# Patient Record
Sex: Female | Born: 1990 | Race: White | Hispanic: No | Marital: Single | Smoking: Never smoker
Health system: Southern US, Community
[De-identification: ages and names within clinical notes are randomized; demographics above are authoritative.]

## PROBLEM LIST (undated history)

## (undated) ENCOUNTER — Inpatient Hospital Stay (HOSPITAL_COMMUNITY): Payer: Self-pay

## (undated) DIAGNOSIS — Z789 Other specified health status: Secondary | ICD-10-CM

## (undated) HISTORY — PX: NO PAST SURGERIES: SHX2092

---
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
## 2012-12-17 ENCOUNTER — Inpatient Hospital Stay (HOSPITAL_COMMUNITY)
Admission: AD | Admit: 2012-12-17 | Discharge: 2012-12-18 | DRG: 781 | Disposition: A | Discharge status: Home / Self Care | Payer: Medicaid Other | Source: Ambulatory Visit | Attending: Obstetrics & Gynecology | Admitting: Obstetrics & Gynecology

## 2012-12-17 ENCOUNTER — Encounter (HOSPITAL_COMMUNITY): Payer: Self-pay | Admitting: *Deleted

## 2012-12-17 ENCOUNTER — Inpatient Hospital Stay (HOSPITAL_COMMUNITY): Payer: Medicaid Other

## 2012-12-17 DIAGNOSIS — N949 Unspecified condition associated with female genital organs and menstrual cycle: Secondary | ICD-10-CM

## 2012-12-17 DIAGNOSIS — M549 Dorsalgia, unspecified: Secondary | ICD-10-CM | POA: Diagnosis present

## 2012-12-17 DIAGNOSIS — O4700 False labor before 37 completed weeks of gestation, unspecified trimester: Secondary | ICD-10-CM | POA: Diagnosis present

## 2012-12-17 DIAGNOSIS — A088 Other specified intestinal infections: Secondary | ICD-10-CM

## 2012-12-17 DIAGNOSIS — R1031 Right lower quadrant pain: Secondary | ICD-10-CM

## 2012-12-17 DIAGNOSIS — O99891 Other specified diseases and conditions complicating pregnancy: Principal | ICD-10-CM | POA: Diagnosis present

## 2012-12-17 DIAGNOSIS — O0930 Supervision of pregnancy with insufficient antenatal care, unspecified trimester: Secondary | ICD-10-CM

## 2012-12-17 DIAGNOSIS — A084 Viral intestinal infection, unspecified: Secondary | ICD-10-CM

## 2012-12-17 DIAGNOSIS — O4703 False labor before 37 completed weeks of gestation, third trimester: Secondary | ICD-10-CM

## 2012-12-17 DIAGNOSIS — O9989 Other specified diseases and conditions complicating pregnancy, childbirth and the puerperium: Secondary | ICD-10-CM

## 2012-12-17 HISTORY — DX: Other specified health status: Z78.9

## 2012-12-17 LAB — COMPREHENSIVE METABOLIC PANEL
ALT: 17 U/L (ref 0–35)
AST: 21 U/L (ref 0–37)
CO2: 21 mEq/L (ref 19–32)
Calcium: 8.7 mg/dL (ref 8.4–10.5)
Chloride: 95 mEq/L — ABNORMAL LOW (ref 96–112)
Creatinine, Ser: 0.42 mg/dL — ABNORMAL LOW (ref 0.50–1.10)
GFR calc Af Amer: >90 mL/min (ref >90)
GFR calc non Af Amer: >90 mL/min (ref >90)
Glucose, Bld: 100 mg/dL — ABNORMAL HIGH (ref 70–99)
Sodium: 132 mEq/L — ABNORMAL LOW (ref 135–145)
Total Bilirubin: 0.5 mg/dL (ref 0.3–1.2)

## 2012-12-17 LAB — CBC
Hemoglobin: 11.1 g/dL — ABNORMAL LOW (ref 12.0–15.0)
MCH: 27.5 pg (ref 26.0–34.0)
MCV: 81.9 fL (ref 78.0–100.0)
Platelets: 215 10*3/uL (ref 150–400)
RBC: 4.03 MIL/uL (ref 3.87–5.11)
WBC: 9.2 10*3/uL (ref 4.0–10.5)

## 2012-12-17 LAB — URINALYSIS, ROUTINE W REFLEX MICROSCOPIC
Ketones, ur: 15 mg/dL — AB
Nitrite: NEGATIVE
Protein, ur: NEGATIVE mg/dL
Urobilinogen, UA: 0.2 mg/dL (ref 0.0–1.0)

## 2012-12-17 LAB — WET PREP, GENITAL: Yeast Wet Prep HPF POC: NONE SEEN

## 2012-12-17 LAB — URINE MICROSCOPIC-ADD ON

## 2012-12-17 MED ORDER — BETAMETHASONE SOD PHOS & ACET 6 (3-3) MG/ML IJ SUSP
12.0000 mg | Freq: Once | INTRAMUSCULAR | Status: AC
  Administered 2012-12-17: 12 mg via INTRAMUSCULAR
  Filled 2012-12-17: qty 2

## 2012-12-17 MED ORDER — CALCIUM CARBONATE ANTACID 500 MG PO CHEW: 2.0000 | CHEWABLE_TABLET | ORAL | Status: DC | PRN

## 2012-12-17 MED ORDER — LACTATED RINGERS IV SOLN
INTRAVENOUS | Status: DC
  Administered 2012-12-17 – 2012-12-18 (×2): via INTRAVENOUS

## 2012-12-17 MED ORDER — CYCLOBENZAPRINE HCL 10 MG PO TABS
10.0000 mg | ORAL_TABLET | ORAL | Status: AC
  Administered 2012-12-17: 10 mg via ORAL
  Filled 2012-12-17: qty 1

## 2012-12-17 MED ORDER — CYCLOBENZAPRINE HCL 10 MG PO TABS: 5.0000 mg | ORAL_TABLET | Freq: Three times a day (TID) | ORAL | Status: DC | PRN

## 2012-12-17 MED ORDER — DOCUSATE SODIUM 100 MG PO CAPS: 100.0000 mg | ORAL_CAPSULE | Freq: Every day | ORAL | Status: DC

## 2012-12-17 MED ORDER — NIFEDIPINE 10 MG PO CAPS
10.0000 mg | ORAL_CAPSULE | ORAL | Status: AC
  Administered 2012-12-17: 10 mg via ORAL
  Filled 2012-12-17: qty 1

## 2012-12-17 MED ORDER — TERBUTALINE SULFATE 1 MG/ML IJ SOLN: 0.2500 mg | Freq: Once | INTRAMUSCULAR | Status: AC | PRN

## 2012-12-17 MED ORDER — ACETAMINOPHEN 500 MG PO TABS
1000.0000 mg | ORAL_TABLET | Freq: Once | ORAL | Status: AC
  Administered 2012-12-17: 1000 mg via ORAL
  Filled 2012-12-17: qty 2

## 2012-12-17 MED ORDER — ACETAMINOPHEN 325 MG PO TABS
650.0000 mg | ORAL_TABLET | ORAL | Status: DC | PRN
  Filled 2012-12-17: qty 2

## 2012-12-17 MED ORDER — LACTATED RINGERS IV BOLUS (SEPSIS)
1000.0000 mL | Freq: Once | INTRAVENOUS | Status: AC
  Administered 2012-12-17: 1000 mL via INTRAVENOUS

## 2012-12-17 MED ORDER — ZOLPIDEM TARTRATE 5 MG PO TABS: 5.0000 mg | ORAL_TABLET | Freq: Every evening | ORAL | Status: DC | PRN

## 2012-12-17 NOTE — MAU Provider Note (Signed)
Chief Complaint: No chief complaint on file.   First Provider Initiated Contact with Patient 12/17/12 1812     SUBJECTIVE HPI: Catherine Shaffer is a 22 y.o. G2P1001 who presents to maternity admissions reporting n/v/diarrhea starting last night with onset of cramping and sharp abdominal pain today.  She has irregular periods and did not know she was pregnant until her pos HPT last week so she has not started care.    Patient's last menstrual period was 08/02/2012.  This period was lighter than normal and she is not exactly sure of the date.  She plans to go to [facility] for care, as she went there for her last pregnancy.  She denies nausea or diarrhea currently.  She denies LOF, vaginal bleeding, vaginal itching/burning, urinary symptoms, h/a, dizziness, fever/chills, or recent exposure to illness.     Past Medical History  Diagnosis Date  . Medical history non-contributory    Past Surgical History  Procedure Laterality Date  . No past surgeries     History   Social History  . Marital Status: Single    Spouse Name: N/A    Number of Children: N/A  . Years of Education: N/A   Occupational History  . Not on file.   Social History Main Topics  . Smoking status: Never Smoker   . Smokeless tobacco: Not on file  . Alcohol Use: No  . Drug Use: No  . Sexually Active: Yes   Other Topics Concern  . Not on file   Social History Narrative  . No narrative on file   No current facility-administered medications on file prior to encounter.   No current outpatient prescriptions on file prior to encounter.   Allergies not on file  ROS: Pertinent items in HPI  OBJECTIVE Blood pressure 134/91, pulse 116, temperature 100.2 F (37.9 C), temperature source Oral, resp. rate 18, height 5\' 3"  (1.6 m), last menstrual period 08/02/2012. GENERAL: Well-developed, well-nourished female in no acute distress.  HEENT: Normocephalic HEART: normal rate RESP: normal effort ABDOMEN: Soft,  non-tender EXTREMITIES: Nontender, no edema NEURO: Alert and oriented Pelvic exam: Cervix pink, visually closed, without lesion, scant white creamy discharge, vaginal walls and external genitalia normal Bimanual exam: Cervix 0/long/high, firm, anterior Fundal height 22 cm  LAB RESULTS Results for orders placed during the hospital encounter of 12/17/12 (from the past 24 hour(s))  URINALYSIS, ROUTINE W REFLEX MICROSCOPIC     Status: Abnormal   Collection Time    12/17/12  5:30 PM      Result Value Range   Color, Urine YELLOW  YELLOW   APPearance CLEAR  CLEAR   Specific Gravity, Urine <1.005 (*) 1.005 - 1.030   pH 6.0  5.0 - 8.0   Glucose, UA NEGATIVE  NEGATIVE mg/dL   Hgb urine dipstick NEGATIVE  NEGATIVE   Bilirubin Urine NEGATIVE  NEGATIVE   Ketones, ur 15 (*) NEGATIVE mg/dL   Protein, ur NEGATIVE  NEGATIVE mg/dL   Urobilinogen, UA 0.2  0.0 - 1.0 mg/dL   Nitrite NEGATIVE  NEGATIVE   Leukocytes, UA TRACE (*) NEGATIVE  URINE MICROSCOPIC-ADD ON     Status: Abnormal   Collection Time    12/17/12  5:30 PM      Result Value Range   Squamous Epithelial / LPF FEW (*) RARE   WBC, UA 3-6  <3 WBC/hpf   RBC / HPF 0-2  <3 RBC/hpf   Bacteria, UA FEW (*) RARE  CBC     Status: Abnormal  Collection Time    12/17/12  6:30 PM      Result Value Range   WBC 9.2  4.0 - 10.5 K/uL   RBC 4.03  3.87 - 5.11 MIL/uL   Hemoglobin 11.1 (*) 12.0 - 15.0 g/dL   HCT 62.1 (*) 30.8 - 65.7 %   MCV 81.9  78.0 - 100.0 fL   MCH 27.5  26.0 - 34.0 pg   MCHC 33.6  30.0 - 36.0 g/dL   RDW 84.6  96.2 - 95.2 %   Platelets 215  150 - 400 K/uL  COMPREHENSIVE METABOLIC PANEL     Status: Abnormal   Collection Time    12/17/12  6:30 PM      Result Value Range   Sodium 132 (*) 135 - 145 mEq/L   Potassium 3.7  3.5 - 5.1 mEq/L   Chloride 95 (*) 96 - 112 mEq/L   CO2 21  19 - 32 mEq/L   Glucose, Bld 100 (*) 70 - 99 mg/dL   BUN 5 (*) 6 - 23 mg/dL   Creatinine, Ser 8.41 (*) 0.50 - 1.10 mg/dL   Calcium 8.7  8.4 - 32.4  mg/dL   Total Protein 6.8  6.0 - 8.3 g/dL   Albumin 2.9 (*) 3.5 - 5.2 g/dL   AST 21  0 - 37 U/L   ALT 17  0 - 35 U/L   Alkaline Phosphatase 151 (*) 39 - 117 U/L   Total Bilirubin 0.5  0.3 - 1.2 mg/dL   GFR calc non Af Amer >90  >90 mL/min   GFR calc Af Amer >90  >90 mL/min    IMAGING US Ob Limited  12/17/2012  *RADIOLOGY REPORT*  Clinical Data: Abdominal pain.  Evaluate placenta. AFI  LIMITED OBSTETRIC ULTRASOUND  Number of Fetuses: 1 Heart Rate: 163 bpm Movement: Yes Presentation: Cephalic Placental Location: Anterior Previa: No  No sonographic evidence of placental abruption.  Amniotic Fluid (Subjective): Normal.  AFI 12.08  ( 5%tile = 9 cm; 95%ile = 23.4 cm)  BPD:  8.2 cm      33 w  0 d         EDC:  02/04/2013  MATERNAL FINDINGS: Cervix: Closed Uterus/Adnexae:  Bilateral ovaries within normal limits.  IMPRESSION:  1.  No evidence for placental abruption. 2.  Normal amniotic fluid index.  Recommend followup with non-emergent complete OB 14+ wk US examination for fetal biometric evaluation and anatomic survey if not already performed.   Original Report Authenticated By: Signa Kell, M.D.     ASSESSMENT 1. Late prenatal care complicating pregnancy   2. Round ligament pain   3. Back pain in pregnancy   4. Viral gastroenteritis     PLAN Flexeril 10 mg PO x1 dose in MAU NST--FHR baseline 150 with moderate variability, positive accels, negative decels, Contractions lasting 50 seconds every 3-4 minutes LR 1000 ml x1 dose and Procardia 10 mg PO x1 dose in MAU Outpatient detail sono ordered for next week [facility] with [facility] or call [facility] if no availability  Report to Sid Falcon, [place]  Sharen Counter Certified Nurse-Midwife 12/17/2012  6:23 PM  Cervix recheck 1-2/25/-2  A: Preterm Contractions  P: Admit to antenatal Rapid GBS BMZ Detailed ultrasound in the AM [facility]

## 2012-12-17 NOTE — MAU Note (Signed)
Pt reports  Having sharp abd pain that started last night. Then started having N/V/D  several times during the night. Diarreha has stopped. N/V stopped around 3 this afternoon but cramping is starting up again.

## 2012-12-17 NOTE — H&P (Signed)
Chief Complaint: No chief complaint on file.   First Provider Initiated Contact with Patient 12/17/12 1812     SUBJECTIVE HPI: Catherine Shaffer is a 22 y.o. G2P1001 who presents to maternity admissions reporting n/v/diarrhea starting last night with onset of cramping and sharp abdominal pain today.  She has irregular periods and did not know she was pregnant until her pos HPT last week so she has not started care.    Patient's last menstrual period was 08/02/2012.  This period was lighter than normal and she is not exactly sure of the date.  She plans to go to [facility] for care, as she went there for her last pregnancy.  She denies nausea or diarrhea currently.  She denies LOF, vaginal bleeding, vaginal itching/burning, urinary symptoms, h/a, dizziness, fever/chills, or recent exposure to illness.     Past Medical History  Diagnosis Date  . Medical history non-contributory    Past Surgical History  Procedure Laterality Date  . No past surgeries     History   Social History  . Marital Status: Single    Spouse Name: N/A    Number of Children: N/A  . Years of Education: N/A   Occupational History  . Not on file.   Social History Main Topics  . Smoking status: Never Smoker   . Smokeless tobacco: Not on file  . Alcohol Use: No  . Drug Use: No  . Sexually Active: Yes   Other Topics Concern  . Not on file   Social History Narrative  . No narrative on file   No current facility-administered medications on file prior to encounter.   No current outpatient prescriptions on file prior to encounter.   Allergies not on file  ROS: Pertinent items in HPI  OBJECTIVE Blood pressure 134/91, pulse 116, temperature 100.2 F (37.9 C), temperature source Oral, resp. rate 18, height 5\' 3"  (1.6 m), last menstrual period 08/02/2012. GENERAL: Well-developed, well-nourished female in no acute distress.  HEENT: Normocephalic HEART: normal rate RESP: normal effort ABDOMEN: Soft,  non-tender EXTREMITIES: Nontender, no edema NEURO: Alert and oriented Pelvic exam: Cervix pink, visually closed, without lesion, scant white creamy discharge, vaginal walls and external genitalia normal Bimanual exam: Cervix 0/long/high, firm, anterior Fundal height 22 cm  LAB RESULTS Results for orders placed during the hospital encounter of 12/17/12 (from the past 24 hour(s))  URINALYSIS, ROUTINE W REFLEX MICROSCOPIC     Status: Abnormal   Collection Time    12/17/12  5:30 PM      Result Value Range   Color, Urine YELLOW  YELLOW   APPearance CLEAR  CLEAR   Specific Gravity, Urine <1.005 (*) 1.005 - 1.030   pH 6.0  5.0 - 8.0   Glucose, UA NEGATIVE  NEGATIVE mg/dL   Hgb urine dipstick NEGATIVE  NEGATIVE   Bilirubin Urine NEGATIVE  NEGATIVE   Ketones, ur 15 (*) NEGATIVE mg/dL   Protein, ur NEGATIVE  NEGATIVE mg/dL   Urobilinogen, UA 0.2  0.0 - 1.0 mg/dL   Nitrite NEGATIVE  NEGATIVE   Leukocytes, UA TRACE (*) NEGATIVE  URINE MICROSCOPIC-ADD ON     Status: Abnormal   Collection Time    12/17/12  5:30 PM      Result Value Range   Squamous Epithelial / LPF FEW (*) RARE   WBC, UA 3-6  <3 WBC/hpf   RBC / HPF 0-2  <3 RBC/hpf   Bacteria, UA FEW (*) RARE  CBC     Status: Abnormal  Collection Time    12/17/12  6:30 PM      Result Value Range   WBC 9.2  4.0 - 10.5 K/uL   RBC 4.03  3.87 - 5.11 MIL/uL   Hemoglobin 11.1 (*) 12.0 - 15.0 g/dL   HCT 16.1 (*) 09.6 - 04.5 %   MCV 81.9  78.0 - 100.0 fL   MCH 27.5  26.0 - 34.0 pg   MCHC 33.6  30.0 - 36.0 g/dL   RDW 40.9  81.1 - 91.4 %   Platelets 215  150 - 400 K/uL  COMPREHENSIVE METABOLIC PANEL     Status: Abnormal   Collection Time    12/17/12  6:30 PM      Result Value Range   Sodium 132 (*) 135 - 145 mEq/L   Potassium 3.7  3.5 - 5.1 mEq/L   Chloride 95 (*) 96 - 112 mEq/L   CO2 21  19 - 32 mEq/L   Glucose, Bld 100 (*) 70 - 99 mg/dL   BUN 5 (*) 6 - 23 mg/dL   Creatinine, Ser 7.82 (*) 0.50 - 1.10 mg/dL   Calcium 8.7  8.4 - 95.6  mg/dL   Total Protein 6.8  6.0 - 8.3 g/dL   Albumin 2.9 (*) 3.5 - 5.2 g/dL   AST 21  0 - 37 U/L   ALT 17  0 - 35 U/L   Alkaline Phosphatase 151 (*) 39 - 117 U/L   Total Bilirubin 0.5  0.3 - 1.2 mg/dL   GFR calc non Af Amer >90  >90 mL/min   GFR calc Af Amer >90  >90 mL/min    IMAGING US Ob Limited  12/17/2012  *RADIOLOGY REPORT*  Clinical Data: Abdominal pain.  Evaluate placenta. AFI  LIMITED OBSTETRIC ULTRASOUND  Number of Fetuses: 1 Heart Rate: 163 bpm Movement: Yes Presentation: Cephalic Placental Location: Anterior Previa: No  No sonographic evidence of placental abruption.  Amniotic Fluid (Subjective): Normal.  AFI 12.08  ( 5%tile = 9 cm; 95%ile = 23.4 cm)  BPD:  8.2 cm      33 w  0 d         EDC:  02/04/2013  MATERNAL FINDINGS: Cervix: Closed Uterus/Adnexae:  Bilateral ovaries within normal limits.  IMPRESSION:  1.  No evidence for placental abruption. 2.  Normal amniotic fluid index.  Recommend followup with non-emergent complete OB 14+ wk US examination for fetal biometric evaluation and anatomic survey if not already performed.   Original Report Authenticated By: Signa Kell, M.D.     ASSESSMENT 1. Late prenatal care complicating pregnancy   2. Round ligament pain   3. Back pain in pregnancy   4. Viral gastroenteritis     Report to Sid Falcon, [place]  Sharen Counter Certified Nurse-Midwife 12/17/2012  6:23 PM  Cervix recheck 1-2/25/-2  A: Preterm Contractions No prenatal care  P: Admit to antenatal Rapid GBS Urine UDS BMZ Procardia 30 mg BID Detailed ultrasound in the AM [facility]

## 2012-12-18 ENCOUNTER — Inpatient Hospital Stay (HOSPITAL_COMMUNITY): Payer: Medicaid Other

## 2012-12-18 ENCOUNTER — Encounter (HOSPITAL_COMMUNITY): Payer: Self-pay | Admitting: *Deleted

## 2012-12-18 LAB — RAPID URINE DRUG SCREEN, HOSP PERFORMED
Amphetamines: NOT DETECTED
Benzodiazepines: NOT DETECTED
Cocaine: NOT DETECTED
Opiates: NOT DETECTED
Tetrahydrocannabinol: NOT DETECTED

## 2012-12-18 LAB — GROUP B STREP BY PCR: Group B strep by PCR: NEGATIVE

## 2012-12-18 MED ORDER — NIFEDIPINE ER 30 MG PO TB24: 30.0000 mg | ORAL_TABLET | Freq: Two times a day (BID) | ORAL | Status: DC

## 2012-12-18 MED ORDER — PRENATAL MULTIVITAMIN CH: 1.0000 | ORAL_TABLET | Freq: Every day | ORAL | Status: DC

## 2012-12-18 MED ORDER — NIFEDIPINE ER 30 MG PO TB24
30.0000 mg | ORAL_TABLET | Freq: Two times a day (BID) | ORAL | Status: DC
  Administered 2012-12-18: 30 mg via ORAL
  Filled 2012-12-18 (×2): qty 1

## 2012-12-18 NOTE — Discharge Summary (Addendum)
Physician Discharge Summary  Patient ID: Catherine Shaffer MRN: [id number] DOB/AGE: October 30, 1991 22 y.o.  Admit date: 12/17/2012 Discharge date: 12/18/2012  Admission Diagnoses: 33.[redacted] weeks EGA, viral gastroenteritis, PTL, no prenatal care  Discharge Diagnoses: same  Active Problems:   * No active hospital problems. *   Discharged Condition: good  Hospital Course: Catherine Shaffer came to the MAU with RLQ pain following a day of n/v/d. The viral symptoms resolved but the toco showed regular contractions and her cervix changed from closed to 1-2 cm. She was admitted overnight for initiation of procardia tocolysis. By the next morning her cervix was unchanged and her contractions were resolved. She will get an anatomy ultrasound prior to discharge. She voiced her happiness at going home.  Consults: None  Significant Diagnostic Studies: labs: GBS culture pending  Treatments: IV hydration and procardia tocolysis  Discharge Exam: Blood pressure 113/61, pulse 104, temperature 98.2 F (36.8 C), temperature source Oral, resp. rate 18, height 5\' 3"  (1.6 m), weight 59.421 kg (131 lb), last menstrual period 07/03/2012, unknown if currently breastfeeding. General appearance: alert Resp: clear to auscultation bilaterally Cardio: regular rate and rhythm, S1, S2 normal, no murmur, click, rub or gallop GI: soft, non-tender; bowel sounds normal; no masses,  no organomegaly Uterus: benign, gravid CVX- 1/20/-2 FHR- reactive, no contractions  Disposition: home after ultrasound  Discharge Orders   Future Orders Complete By Expires     US OB Detail + 14 Wk  12/20/2012 (Approximate) 02/14/2014    Questions:      Preferred imaging location?:  [facility]    Reason for exam:  routine anatomy scan        Medication List    TAKE these medications       cyclobenzaprine 10 MG tablet  Commonly known as:  FLEXERIL  Take 0.5 tablets (5 mg total) by mouth 3 (three) times daily as needed for muscle spasms.      NIFEdipine 30 MG 24 hr tablet  Commonly known as:  PROCARDIA-XL/ADALAT CC  Take 1 tablet (30 mg total) by mouth 2 (two) times daily.     prenatal multivitamin Tabs  Take 1 tablet by mouth daily.     prenatal multivitamin Tabs  Take 1 tablet by mouth daily.           Follow-up Information   Follow up with [facility]. Schedule an appointment as soon as possible for a visit in 1 week. (Make appointment with prenatal provide in [place], or call number below for appointment in [facility] clinic. Return to MAU as needed.)    Contact information:   [address] [place] [address] [phone]      Signed: Allie Bossier 12/18/2012, 6:52 AM

## 2012-12-18 NOTE — Discharge Instructions (Signed)
Abdominal Pain During Pregnancy Abdominal discomfort is common in pregnancy. Most of the time, it does not cause harm. There are many causes of abdominal pain. Some causes are more serious than others. Some of the causes of abdominal pain in pregnancy are easily diagnosed. Occasionally, the diagnosis takes time to understand. Other times, the cause is not determined. Abdominal pain can be a sign that something is very wrong with the pregnancy, or the pain may have nothing to do with the pregnancy at all. For this reason, always tell your caregiver if you have any abdominal discomfort. CAUSES Common and harmless causes of abdominal pain include:  Constipation.  Excess gas and bloating.  Round ligament pain. This is pain that is felt in the folds of the groin.  The position the baby or placenta is in.  Baby kicks.  Braxton-Hicks contractions. These are mild contractions that do not cause cervical dilation. Serious causes of abdominal pain include:  Ectopic pregnancy. This happens when a fertilized egg implants outside of the uterus.  Miscarriage.  Preterm labor. This is when labor starts at less than 37 weeks of pregnancy.  Placental abruption. This is when the placenta partially or completely separates from the uterus.  Preeclampsia. This is often associated with high blood pressure and has been referred to as "toxemia in pregnancy."  Uterine or amniotic fluid infections. Causes unrelated to pregnancy include:  Urinary tract infection.  Gallbladder stones or inflammation.  Hepatitis or other liver illness.  Intestinal problems, stomach flu, food poisoning, or ulcer.  Appendicitis.  Kidney (renal) stones.  Kidney infection (pylonephritis). HOME CARE INSTRUCTIONS  For mild pain:  Do not have sexual intercourse or put anything in your vagina until your symptoms go away completely.  Get plenty of rest until your pain improves. If your pain does not improve in 1 hour, call  your caregiver.  Drink clear fluids if you feel nauseous. Avoid solid food as long as you are uncomfortable or nauseous.  Only take medicine as directed by your caregiver.  Keep all follow-up appointments with your caregiver. SEEK IMMEDIATE MEDICAL CARE IF:  You are bleeding, leaking fluid, or passing tissue from the vagina.  You have increasing pain or cramping.  You have persistent vomiting.  You have painful or bloody urination.  You have a fever.  You notice a decrease in your baby's movements.  You have extreme weakness or feel faint.  You have shortness of breath, with or without abdominal pain.  You develop a severe headache with abdominal pain.  You have abnormal vaginal discharge with abdominal pain.  You have persistent diarrhea.  You have abdominal pain that continues even after rest, or gets worse. MAKE SURE YOU:   Understand these instructions.  Will watch your condition.  Will get help right away if you are not doing well or get worse. Document Released: 10/19/2005 Document Revised: 01/11/2012 Document Reviewed: 05/15/2011 [facility] Patient Information 2013 [place], [place]. Back Pain in Pregnancy Back pain during pregnancy is common. It happens in about half of all pregnancies. It is important for you and your baby that you remain active during your pregnancy.If you feel that back pain is not allowing you to remain active or sleep well, it is time to see your caregiver. Back pain may be caused by several factors related to changes during your pregnancy.Fortunately, unless you had trouble with your back before your pregnancy, the pain is likely to get better after you deliver. Low back pain usually occurs between the fifth  and seventh months of pregnancy. It can, however, happen in the first couple months. Factors that increase the risk of back problems include:   Previous back problems.  Injury to your back.  Having twins or multiple births.  A  chronic cough.  Stress.  Job-related repetitive motions.  Muscle or spinal disease in the back.  Family history of back problems, ruptured (herniated) discs, or osteoporosis.  Depression, anxiety, and panic attacks. CAUSES   When you are pregnant, your body produces a hormone called relaxin. This hormonemakes the ligaments connecting the low back and pubic bones more flexible. This flexibility allows the baby to be delivered more easily. When your ligaments are loose, your muscles need to work harder to support your back. Soreness in your back can come from tired muscles. Soreness can also come from back tissues that are irritated since they are receiving less support.  As the baby grows, it puts pressure on the nerves and blood vessels in your pelvis. This can cause back pain.  As the baby grows and gets heavier during pregnancy, the uterus pushes the stomach muscles forward and changes your center of gravity. This makes your back muscles work harder to maintain good posture. SYMPTOMS  Lumbar pain during pregnancy Lumbar pain during pregnancy usually occurs at or above the waist in the center of the back. There may be pain and numbness that radiates into your leg or foot. This is similar to low back pain experienced by non-pregnant women. It usually increases with sitting for long periods of time, standing, or repetitive lifting. Tenderness may also be present in the muscles along your upper back. Posterior pelvic pain during pregnancy Pain in the back of the pelvis is more common than lumbar pain in pregnancy. It is a deep pain felt in your side at the waistline, or across the tailbone (sacrum), or in both places. You may have pain on one or both sides. This pain can also go into the buttocks and backs of the upper thighs. Pubic and groin pain may also be present. The pain does not quickly resolve with rest, and morning stiffness may also be present. Pelvic pain during pregnancy can be  brought on by most activities. A high level of fitness before and during pregnancy may or may not prevent this problem. Labor pain is usually 1 to 2 minutes apart, lasts for about 1 minute, and involves a bearing down feeling or pressure in your pelvis. However, if you are at term with the pregnancy, constant low back pain can be the beginning of early labor, and you should be aware of this. DIAGNOSIS  X-rays of the back should not be done during the first 12 to 14 weeks of the pregnancy and only when absolutely necessary during the rest of the pregnancy. MRIs do not give off radiation and are safe during pregnancy. MRIs also should only be done when absolutely necessary. HOME CARE INSTRUCTIONS  Exercise as directed by your caregiver. Exercise is the most effective way to prevent or manage back pain. If you have a back problem, it is especially important to avoid sports that require sudden body movements. Swimming and walking are great activities.  Do not stand in one place for long periods of time.  Do not wear high heels.  Sit in chairs with good posture. Use a pillow on your lower back if necessary. Make sure your head rests over your shoulders and is not hanging forward.  Try sleeping on your side, preferably the  left side, with a pillow or two between your legs. If you are sore after a night's rest, your bedmay betoo soft.Try placing a board between your mattress and box spring.  Listen to your body when lifting.If you are experiencing pain, ask for help or try bending yourknees more so you can use your leg muscles rather than your back muscles. Squat down when picking up something from the floor. Do not bend over.  Eat a healthy diet. Try to gain weight within your caregiver's recommendations.  Use heat or cold packs 3 to 4 times a day for 15 minutes to help with the pain.  Only take over-the-counter or prescription medicines for pain, discomfort, or fever as directed by your  caregiver. Sudden (acute) back pain  Use bed rest for only the most extreme, acute episodes of back pain. Prolonged bed rest over 48 hours will aggravate your condition.  Ice is very effective for acute conditions.  Put ice in a plastic bag.  Place a towel between your skin and the bag.  Leave the ice on for 10 to 20 minutes every 2 hours, or as needed.  Using heat packs for 30 minutes prior to activities is also helpful. Continued back pain See your caregiver if you have continued problems. Your caregiver can help or refer you for appropriate physical therapy. With conditioning, most back problems can be avoided. Sometimes, a more serious issue may be the cause of back pain. You should be seen right away if new problems seem to be developing. Your caregiver may recommend:  A maternity girdle.  An elastic sling.  A back brace.  A massage therapist or acupuncture. SEEK MEDICAL CARE IF:   You are not able to do most of your daily activities, even when taking the pain medicine you were given.  You need a referral to a physical therapist or chiropractor.  You want to try acupuncture. SEEK IMMEDIATE MEDICAL CARE IF:  You develop numbness, tingling, weakness, or problems with the use of your arms or legs.  You develop severe back pain that is no longer relieved with medicines.  You have a sudden change in bowel or bladder control.  You have increasing pain in other areas of the body.  You develop shortness of breath, dizziness, or fainting.  You develop nausea, vomiting, or sweating.  You have back pain which is similar to labor pains.  You have back pain along with your water breaking or vaginal bleeding.  You have back pain or numbness that travels down your leg.  Your back pain developed after you fell.  You develop pain on one side of your back. You may have a kidney stone.  You see blood in your urine. You may have a bladder infection or kidney stone.  You have  back pain with blisters. You may have shingles. Back pain is fairly common during pregnancy but should not be accepted as just part of the process. Back pain should always be treated as soon as possible. This will make your pregnancy as pleasant as possible. Document Released: 01/27/2006 Document Revised: 01/11/2012 Document Reviewed: 03/10/2011 [employer] Patient Information 2013 ExitCarPreterm Labor Preterm labor is when labor starts at less than 37 weeks of pregnancy. The normal length of a pregnancy is 39 to 41 weeks. CAUSES Often, there is no identifiable underlying cause as to why a woman goes into preterm labor. However, one of the most common known causes of preterm labor is infection. Infections of the uterus, cervix, vagina,  amniotic sac, bladder, kidney, or even the lungs (pneumonia) can cause labor to start. Other causes of preterm labor include:  Urogenital infections, such as yeast infections and bacterial vaginosis.  Uterine abnormalities (uterine shape, uterine septum, fibroids, bleeding from the placenta).  A cervix that has been operated on and opens prematurely.  Malformations in the baby.  Multiple gestations (twins, triplets, and so on).  Breakage of the amniotic sac. Additional risk factors for preterm labor include:  Previous history of preterm labor.  Premature rupture of membranes (PROM).  A placenta that covers the opening of the cervix (placenta previa).  A placenta that separates from the uterus (placenta abruption).  A cervix that is too weak to hold the baby in the uterus (incompetence cervix).  Having too much fluid in the amniotic sac (polyhydramnios).  Taking illegal drugs or smoking while pregnant.  Not gaining enough weight while pregnant.  Women younger than 38 and older than 23 years old.  Low socioeconomic status.  African-American ethnicity. SYMPTOMS Signs and symptoms of preterm labor include:  Menstrual-like cramps.  Contractions  that are 30 to 70 seconds apart, become very regular, closer together, and are more intense and painful.  Contractions that start on the top of the uterus and spread down to the lower abdomen and back.  A sense of increased pelvic pressure or back pain.  A watery or bloody discharge that comes from the vagina. DIAGNOSIS  A diagnosis can be confirmed by:  A vaginal exam.  An ultrasound of the cervix.  Sampling (swabbing) cervico-vaginal secretions. These samples can be tested for the presence of fetal fibronectin. This is a protein found in cervical discharge which is associated with preterm labor.  Fetal monitoring. TREATMENT  Depending on the length of the pregnancy and other circumstances, a caregiver may suggest bed rest. If necessary, there are medicines that can be given to stop contractions and to quicken fetal lung maturity. If labor happens before 34 weeks of pregnancy, a prolonged hospital stay may be recommended. Treatment depends on the condition of both the mother and baby. PREVENTION There are some things a mother can do to lower the risk of preterm labor in future pregnancies. A woman can:   Stop smoking.  Maintain healthy weight gain and avoid chemicals and drugs that are not necessary.  Be watchful for any type of infection.  Inform her caregiver if she has a known history of preterm labor. Document Released: 01/09/2004 Document Revised: 01/11/2012 Document Reviewed: 02/13/2011 [facility] Patient Information 2013 [place], [place].

## 2012-12-19 LAB — URINE CULTURE: Culture: NO GROWTH

## 2012-12-19 NOTE — Progress Notes (Signed)
Post discharge review completed. 

## 2013-10-22 ENCOUNTER — Encounter (HOSPITAL_COMMUNITY): Payer: Self-pay | Admitting: *Deleted

## 2013-11-17 ENCOUNTER — Emergency Department (HOSPITAL_COMMUNITY): Payer: Self-pay

## 2013-11-17 ENCOUNTER — Emergency Department (HOSPITAL_COMMUNITY)
Admission: EM | Admit: 2013-11-17 | Discharge: 2013-11-17 | Disposition: A | Discharge status: Home / Self Care | Payer: Self-pay | Attending: Emergency Medicine | Admitting: Emergency Medicine

## 2013-11-17 ENCOUNTER — Encounter (HOSPITAL_COMMUNITY): Payer: Self-pay | Admitting: Emergency Medicine

## 2013-11-17 DIAGNOSIS — Z880 Allergy status to penicillin: Secondary | ICD-10-CM | POA: Insufficient documentation

## 2013-11-17 DIAGNOSIS — R112 Nausea with vomiting, unspecified: Secondary | ICD-10-CM | POA: Insufficient documentation

## 2013-11-17 DIAGNOSIS — R109 Unspecified abdominal pain: Secondary | ICD-10-CM

## 2013-11-17 DIAGNOSIS — R1031 Right lower quadrant pain: Secondary | ICD-10-CM | POA: Insufficient documentation

## 2013-11-17 DIAGNOSIS — Z3202 Encounter for pregnancy test, result negative: Secondary | ICD-10-CM | POA: Insufficient documentation

## 2013-11-17 LAB — CBC WITH DIFFERENTIAL/PLATELET
BASOS ABS: 0 10*3/uL (ref 0.0–0.1)
BASOS PCT: 0 % (ref 0–1)
Eosinophils Absolute: 0.1 10*3/uL (ref 0.0–0.7)
Eosinophils Relative: 1 % (ref 0–5)
HEMATOCRIT: 37.8 % (ref 36.0–46.0)
HEMOGLOBIN: 13.6 g/dL (ref 12.0–15.0)
LYMPHS PCT: 57 % — AB (ref 12–46)
Lymphs Abs: 2.3 10*3/uL (ref 0.7–4.0)
MCH: 30.1 pg (ref 26.0–34.0)
MCHC: 36 g/dL (ref 30.0–36.0)
MCV: 83.6 fL (ref 78.0–100.0)
MONO ABS: 0.4 10*3/uL (ref 0.1–1.0)
Monocytes Relative: 10 % (ref 3–12)
NEUTROS ABS: 1.3 10*3/uL — AB (ref 1.7–7.7)
NEUTROS PCT: 32 % — AB (ref 43–77)
Platelets: 224 10*3/uL (ref 150–400)
RBC: 4.52 MIL/uL (ref 3.87–5.11)
RDW: 13.8 % (ref 11.5–15.5)
WBC: 4.1 10*3/uL (ref 4.0–10.5)

## 2013-11-17 LAB — WET PREP, GENITAL
Trich, Wet Prep: NONE SEEN
YEAST WET PREP: NONE SEEN

## 2013-11-17 LAB — PREGNANCY, URINE: PREG TEST UR: NEGATIVE

## 2013-11-17 LAB — URINALYSIS, ROUTINE W REFLEX MICROSCOPIC
GLUCOSE, UA: NEGATIVE mg/dL
Hgb urine dipstick: NEGATIVE
NITRITE: NEGATIVE
PH: 5.5 (ref 5.0–8.0)
Specific Gravity, Urine: >1.03 — ABNORMAL HIGH (ref 1.005–1.030)
Urobilinogen, UA: 0.2 mg/dL (ref 0.0–1.0)

## 2013-11-17 LAB — BASIC METABOLIC PANEL
BUN: 13 mg/dL (ref 6–23)
CHLORIDE: 99 meq/L (ref 96–112)
CO2: 22 meq/L (ref 19–32)
CREATININE: 0.5 mg/dL (ref 0.50–1.10)
Calcium: 9.5 mg/dL (ref 8.4–10.5)
GFR calc non Af Amer: >90 mL/min (ref >90)
Glucose, Bld: 109 mg/dL — ABNORMAL HIGH (ref 70–99)
POTASSIUM: 3.4 meq/L — AB (ref 3.7–5.3)
Sodium: 136 mEq/L — ABNORMAL LOW (ref 137–147)

## 2013-11-17 LAB — URINE MICROSCOPIC-ADD ON

## 2013-11-17 MED ORDER — SODIUM CHLORIDE 0.9 % IV BOLUS (SEPSIS)
1000.0000 mL | Freq: Once | INTRAVENOUS | Status: AC
  Administered 2013-11-17: 1000 mL via INTRAVENOUS

## 2013-11-17 MED ORDER — IOHEXOL 300 MG/ML  SOLN
100.0000 mL | Freq: Once | INTRAMUSCULAR | Status: AC | PRN
Start: 2013-11-17 — End: 2013-11-17
  Administered 2013-11-17: 100 mL via INTRAVENOUS

## 2013-11-17 MED ORDER — DIPHENHYDRAMINE HCL 50 MG/ML IJ SOLN
12.5000 mg | Freq: Once | INTRAMUSCULAR | Status: AC
  Administered 2013-11-17: 12.5 mg via INTRAVENOUS
  Filled 2013-11-17: qty 1

## 2013-11-17 MED ORDER — MORPHINE SULFATE 4 MG/ML IJ SOLN
4.0000 mg | Freq: Once | INTRAMUSCULAR | Status: AC
  Administered 2013-11-17: 4 mg via INTRAVENOUS
  Filled 2013-11-17: qty 1

## 2013-11-17 MED ORDER — IOHEXOL 300 MG/ML  SOLN
50.0000 mL | Freq: Once | INTRAMUSCULAR | Status: AC | PRN
  Administered 2013-11-17: 50 mL via ORAL

## 2013-11-17 MED ORDER — HYDROCODONE-ACETAMINOPHEN 5-325 MG PO TABS: 1.0000 | ORAL_TABLET | Freq: Four times a day (QID) | ORAL | Status: AC | PRN

## 2013-11-17 NOTE — ED Provider Notes (Signed)
CSN: [id number]     Arrival date & time 11/17/13  4098 History  This chart was scribed for Catherine Baton, MD by Dorothey Baseman, ED Scribe. This patient was seen in room APA03/APA03 and the patient's care was started at 10:06 AM.    Chief Complaint  Patient presents with  . Flank Pain   The history is provided by the patient. No language interpreter was used.   HPI Comments: Catherine Shaffer is a 23 y.o. female who presents to the Emergency Department complaining of a constant pain to the right flank, described as a "pinching" and is 7/10 currently, that radiates to the right side of the groin onset 3 days ago. Patient reports that the pain radiation has been gradually improving. She states that the pain is exacerbated with movement and alleviated with laying down/rest. She denies any potential injury or trauma to the area or recent heavy lifting. She reports associated nausea and emesis yesterday. Patient reports that she has experienced similar symptoms before after giving birth to her last child. She denies dysuria, hematuria, bowel or bladder incontinence. She denies history of kidney stones. She denies the possibility of being pregnant and currently uses Mirena for birth control. Patient has no other pertinent medical history.   Past Medical History  Diagnosis Date  . Medical history non-contributory    Past Surgical History  Procedure Laterality Date  . No past surgeries     Family History  Problem Relation Age of Onset  . Hypertension Father    History  Substance Use Topics  . Smoking status: Never Smoker   . Smokeless tobacco: Never Used  . Alcohol Use: No   OB History   Grav Para Term Preterm Abortions TAB SAB Ect Mult Living   2 1 1       1      Review of Systems  Constitutional: Negative for fever.  Respiratory: Negative for cough, chest tightness and shortness of breath.   Cardiovascular: Negative for chest pain.  Gastrointestinal: Positive for nausea and vomiting.  Negative for abdominal pain.  Genitourinary: Positive for flank pain. Negative for dysuria, hematuria, vaginal discharge and difficulty urinating.  Musculoskeletal: Negative for back pain.  Neurological: Negative for headaches.  All other systems reviewed and are negative.    Allergies  Penicillins  Home Medications   Current Outpatient Rx  Name  Route  Sig  Dispense  Refill  . ibuprofen (ADVIL,MOTRIN) 200 MG tablet   Oral   Take 400 mg by mouth every 8 (eight) hours as needed for moderate pain.         Marland Kitchen HYDROcodone-acetaminophen (NORCO/VICODIN) 5-325 MG per tablet   Oral   Take 1 tablet by mouth every 6 (six) hours as needed.   10 tablet   0    Triage Vitals: BP 115/77  Pulse 69  Temp(Src) 98 F (36.7 C) (Oral)  Resp 18  SpO2 100%  LMP 11/01/2013  Physical Exam  Nursing note and vitals reviewed. Constitutional: She is oriented to person, place, and time. She appears well-developed and well-nourished. No distress.  HENT:  Head: Normocephalic and atraumatic.  Eyes: Pupils are equal, round, and reactive to light.  Neck: Neck supple.  Cardiovascular: Normal rate, regular rhythm and normal heart sounds.   Pulmonary/Chest: Effort normal and breath sounds normal. No respiratory distress. She has no wheezes.  Abdominal: Soft. Bowel sounds are normal. There is tenderness. There is no rebound and no guarding.  RLQ tenderness  Neurological: She is alert  and oriented to person, place, and time.  Skin: Skin is warm and dry.  Psychiatric: She has a normal mood and affect.  GU:  NO significant vaginal discharge, normal external genitalia, no cervical motion tenderness or adnexal tenderness  ED Course  Procedures (including critical care time)  DIAGNOSTIC STUDIES: Oxygen Saturation is 100% on room air, normal by my interpretation.    COORDINATION OF CARE: 10:08 AM- Will order blood labs and UA. Will order IV fluids and morphine to manage symptoms. Discussed treatment plan  with patient at bedside and patient verbalized agreement.   11:07 AM- Will order a CT of the abdomen. Discussed treatment plan with patient at bedside and patient verbalized agreement.   Labs Review Labs Reviewed  WET PREP, GENITAL - Abnormal; Notable for the following:    Clue Cells Wet Prep HPF POC FEW (*)    WBC, Wet Prep HPF POC MANY (*)    All other components within normal limits  URINALYSIS, ROUTINE W REFLEX MICROSCOPIC - Abnormal; Notable for the following:    Specific Gravity, Urine >1.030 (*)    Bilirubin Urine SMALL (*)    Ketones, ur TRACE (*)    Protein, ur TRACE (*)    Leukocytes, UA SMALL (*)    All other components within normal limits  CBC WITH DIFFERENTIAL - Abnormal; Notable for the following:    Neutrophils Relative % 32 (*)    Neutro Abs 1.3 (*)    Lymphocytes Relative 57 (*)    All other components within normal limits  BASIC METABOLIC PANEL - Abnormal; Notable for the following:    Sodium 136 (*)    Potassium 3.4 (*)    Glucose, Bld 109 (*)    All other components within normal limits  URINE MICROSCOPIC-ADD ON - Abnormal; Notable for the following:    Squamous Epithelial / LPF MANY (*)    All other components within normal limits  GC/CHLAMYDIA PROBE AMP  PREGNANCY, URINE   Imaging Review Ct Abdomen Pelvis W Contrast  11/17/2013   CLINICAL DATA:  Right-sided abdominal pain of 3 days duration.  EXAM: CT ABDOMEN AND PELVIS WITH CONTRAST  TECHNIQUE: Multidetector CT imaging of the abdomen and pelvis was performed using the standard protocol following bolus administration of intravenous contrast.  CONTRAST:  50mL OMNIPAQUE IOHEXOL 300 MG/ML SOLN, OMNIPAQUE IOHEXOL 300 MG/ML SOLN  COMPARISON:  None.  FINDINGS: Lung bases are clear. No pleural or pericardial fluid. The liver has a normal appearance. No calcified gallstones. The spleen is normal. The pancreas is normal. The adrenal glands are normal. The kidneys are normal. No cyst, mass, stone or  hydronephrosis. The aorta and IVC are normal. The uterus is retroverted. IUD is in place. No acute finding. No adnexal lesion. No free fluid in the pelvis. The appendix is well seen in a normal location within normal appearance. No other bowel pathology.  IMPRESSION: No pathologic finding. No cause of right-sided pain identified. Normal appearing appendix.  Incidental note of retroverted uterus with IUD.   Electronically Signed   By: Paulina Fusi M.D.   On: 11/17/2013 12:57    EKG Interpretation   None      Medications  sodium chloride 0.9 % bolus 1,000 mL (0 mLs Intravenous Stopped 11/17/13 1233)  morphine 4 MG/ML injection 4 mg (4 mg Intravenous Given 11/17/13 1034)  diphenhydrAMINE (BENADRYL) injection 12.5 mg (12.5 mg Intravenous Given 11/17/13 1059)  iohexol (OMNIPAQUE) 300 MG/ML solution 50 mL (50 mLs Oral Contrast Given 11/17/13  1111)  iohexol (OMNIPAQUE) 300 MG/ML solution 100 mL (100 mLs Intravenous Contrast Given 11/17/13 1227)    MDM   1. Flank pain     Patient presents with right flank and right lower quadrant pain. She is nontoxic-appearing on exam. She has no history of kidney stones. She is tender on palpation. Considerations include kidney stones, appendicitis, and ovarian pathology including cyst versus torsion. Patient has had constant symptoms for 3 days.  CT is neg for appendicitis and kidney stones.  No noted ovarian pathology and pelvic exam benign.  Low suspicion for torsion given exam and imaging.  Patient continues to be well appearing.  No apparent etiology for pain.  Will send home with a short course of pain medication and PCP referral.  After history, exam, and medical workup I feel the patient has been appropriately medically screened and is safe for discharge home. Pertinent diagnoses were discussed with the patient. Patient was given return precautions.   I personally performed the services described in this documentation, which was scribed in my presence. The  recorded information has been reviewed and is accurate.    Catherine Batonourtney F Horton, MD 11/18/13 [phone]

## 2013-11-17 NOTE — ED Notes (Signed)
Pt c/o right side flank pain x3 days. Pt states "I had this same problem after I had my last child". Pt denies dysuria and hematuria. Pt states any movement worsens pain.

## 2013-11-17 NOTE — Discharge Instructions (Signed)
Abdominal Pain, Women °Abdominal (stomach, pelvic, or belly) pain can be caused by many things. It is important to tell your doctor: °· The location of the pain. °· Does it come and go or is it present all the time? °· Are there things that start the pain (eating certain foods, exercise)? °· Are there other symptoms associated with the pain (fever, nausea, vomiting, diarrhea)? °All of this is helpful to know when trying to find the cause of the pain. °CAUSES  °· Stomach: virus or bacteria infection, or ulcer. °· Intestine: appendicitis (inflamed appendix), regional ileitis (Crohn's disease), ulcerative colitis (inflamed colon), irritable bowel syndrome, diverticulitis (inflamed diverticulum of the colon), or cancer of the stomach or intestine. °· Gallbladder disease or stones in the gallbladder. °· Kidney disease, kidney stones, or infection. °· Pancreas infection or cancer. °· Fibromyalgia (pain disorder). °· Diseases of the female organs: °· Uterus: fibroid (non-cancerous) tumors or infection. °· Fallopian tubes: infection or tubal pregnancy. °· Ovary: cysts or tumors. °· Pelvic adhesions (scar tissue). °· Endometriosis (uterus lining tissue growing in the pelvis and on the pelvic organs). °· Pelvic congestion syndrome (female organs filling up with blood just before the menstrual period). °· Pain with the menstrual period. °· Pain with ovulation (producing an egg). °· Pain with an IUD (intrauterine device, birth control) in the uterus. °· Cancer of the female organs. °· Functional pain (pain not caused by a disease, may improve without treatment). °· Psychological pain. °· Depression. °DIAGNOSIS  °Your doctor will decide the seriousness of your pain by doing an examination. °· Blood tests. °· X-rays. °· Ultrasound. °· CT scan (computed tomography, special type of X-ray). °· MRI (magnetic resonance imaging). °· Cultures, for infection. °· Barium enema (dye inserted in the large intestine, to better view it with  X-rays). °· Colonoscopy (looking in intestine with a lighted tube). °· Laparoscopy (minor surgery, looking in abdomen with a lighted tube). °· Major abdominal exploratory surgery (looking in abdomen with a large incision). °TREATMENT  °The treatment will depend on the cause of the pain.  °· Many cases can be observed and treated at home. °· Over-the-counter medicines recommended by your caregiver. °· Prescription medicine. °· Antibiotics, for infection. °· Birth control pills, for painful periods or for ovulation pain. °· Hormone treatment, for endometriosis. °· Nerve blocking injections. °· Physical therapy. °· Antidepressants. °· Counseling with a psychologist or psychiatrist. °· Minor or major surgery. °HOME CARE INSTRUCTIONS  °· Do not take laxatives, unless directed by your caregiver. °· Take over-the-counter pain medicine only if ordered by your caregiver. Do not take aspirin because it can cause an upset stomach or bleeding. °· Try a clear liquid diet (broth or water) as ordered by your caregiver. Slowly move to a bland diet, as tolerated, if the pain is related to the stomach or intestine. °· Have a thermometer and take your temperature several times a day, and record it. °· Bed rest and sleep, if it helps the pain. °· Avoid sexual intercourse, if it causes pain. °· Avoid stressful situations. °· Keep your follow-up appointments and tests, as your caregiver orders. °· If the pain does not go away with medicine or surgery, you may try: °· Acupuncture. °· Relaxation exercises (yoga, meditation). °· Group therapy. °· Counseling. °SEEK MEDICAL CARE IF:  °· You notice certain foods cause stomach pain. °· Your home care treatment is not helping your pain. °· You need stronger pain medicine. °· You want your IUD removed. °· You feel faint or   lightheaded. °· You develop nausea and vomiting. °· You develop a rash. °· You are having side effects or an allergy to your medicine. °SEEK IMMEDIATE MEDICAL CARE IF:  °· Your  pain does not go away or gets worse. °· You have a fever. °· Your pain is felt only in portions of the abdomen. The right side could possibly be appendicitis. The left lower portion of the abdomen could be colitis or diverticulitis. °· You are passing blood in your stools (bright red or black tarry stools, with or without vomiting). °· You have blood in your urine. °· You develop chills, with or without a fever. °· You pass out. °MAKE SURE YOU:  °· Understand these instructions. °· Will watch your condition. °· Will get help right away if you are not doing well or get worse. °Document Released: 08/16/2007 Document Revised: 01/11/2012 Document Reviewed: 09/05/2009 °[employer]® Patient Information ©2014 [employer]. ° °

## 2013-11-18 LAB — GC/CHLAMYDIA PROBE AMP
CT PROBE, AMP APTIMA: NEGATIVE
GC PROBE AMP APTIMA: NEGATIVE

## 2014-09-03 ENCOUNTER — Encounter (HOSPITAL_COMMUNITY): Payer: Self-pay | Admitting: Emergency Medicine

## 2015-09-25 IMAGING — CT CT ABD-PELV W/ CM
2 of 3 series · 16 of 46 positions shown, 18 images · IV contrast (Omnipaque 300)
Comparison: None.

CLINICAL DATA: Right-sided abdominal pain of 3 days duration.

EXAM:
CT ABDOMEN AND PELVIS WITH CONTRAST
TECHNIQUE: Multidetector CT imaging of the abdomen and pelvis was performed
using the standard protocol following bolus administration of
intravenous contrast.
CONTRAST:  50mL OMNIPAQUE IOHEXOL 300 MG/ML SOLN, 100mL OMNIPAQUE
IOHEXOL 300 MG/ML SOLN

[Series 2: abd_pel_with 5.0 b40f · axial · 0.59mm/px · z∈[-424,-78]mm · 13 of 81 slices shown, 15 images]
[im 6/81  soft-tissue]
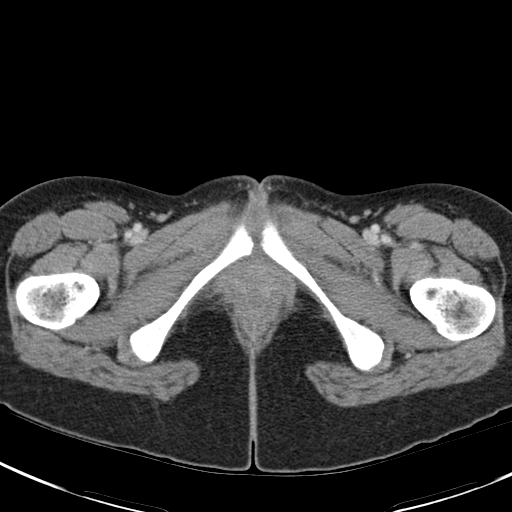
[im 6/81  bone]
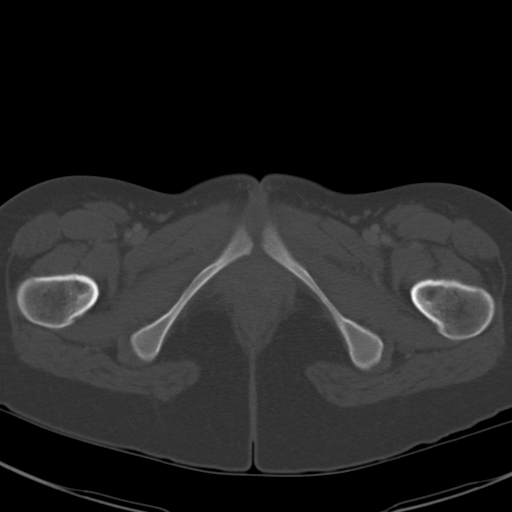
[im 11/81  soft-tissue]
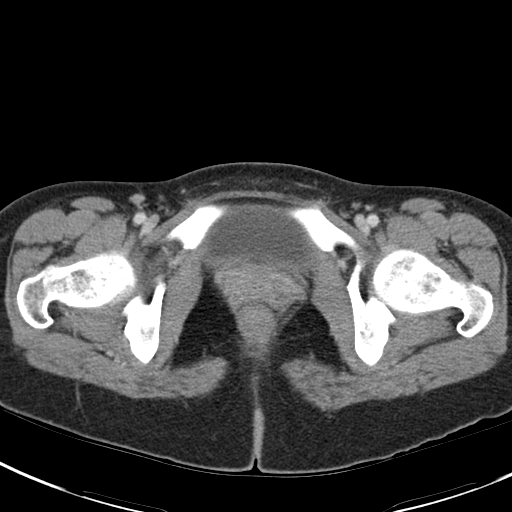
[im 16/81  soft-tissue]
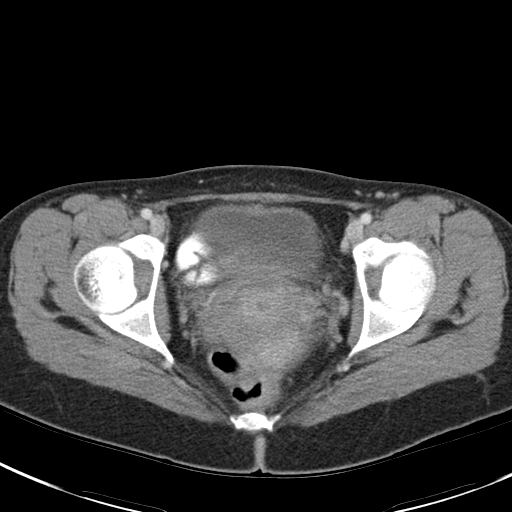
[im 24/81  soft-tissue]
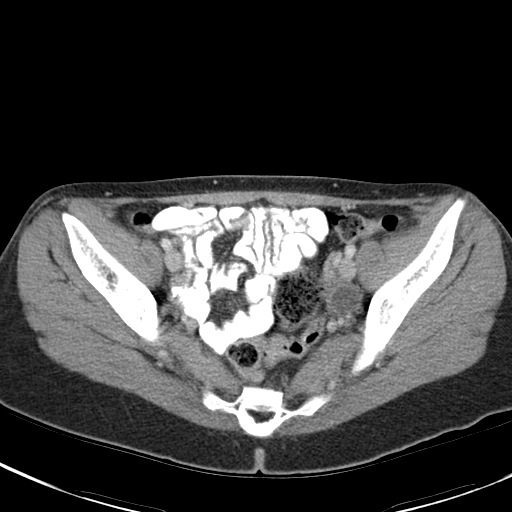
[im 29/81  soft-tissue]
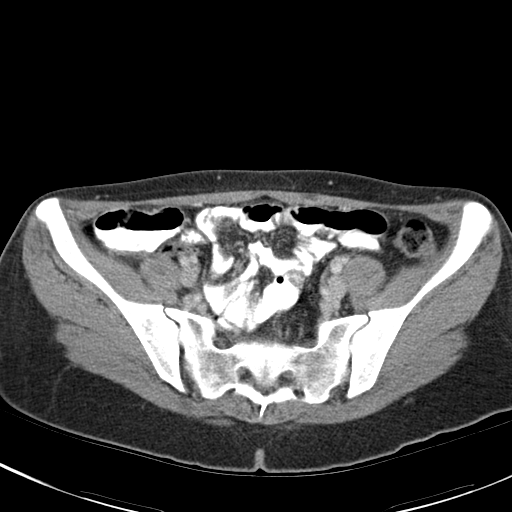
[im 34/81  soft-tissue]
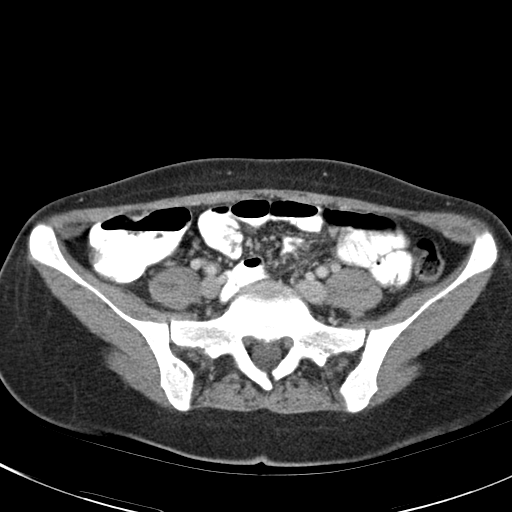
[im 42/81  soft-tissue]
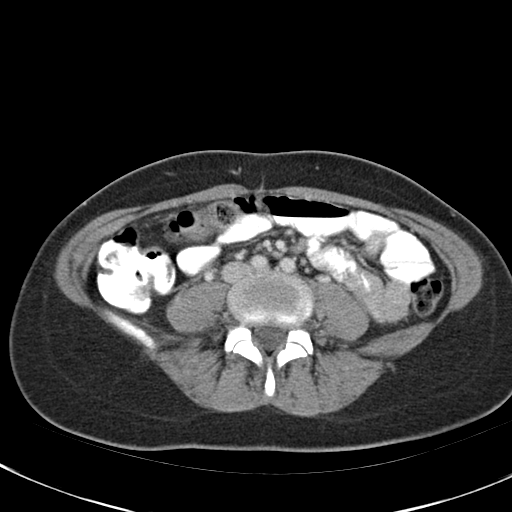
[im 47/81  soft-tissue]
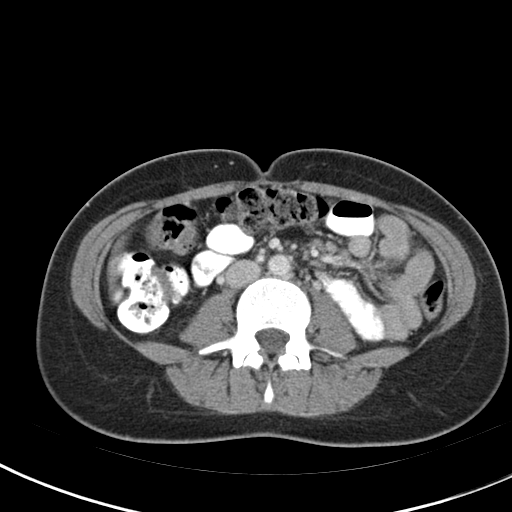
[im 52/81  soft-tissue]
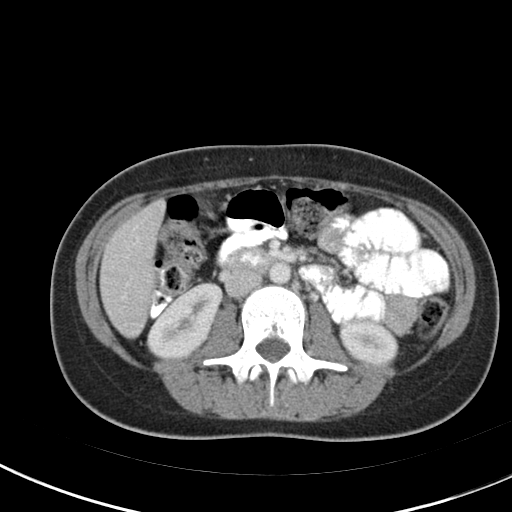
[im 52/81  bone]
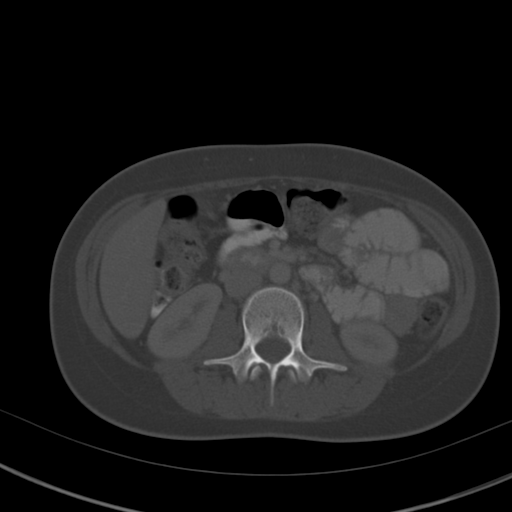
[im 57/81  soft-tissue]
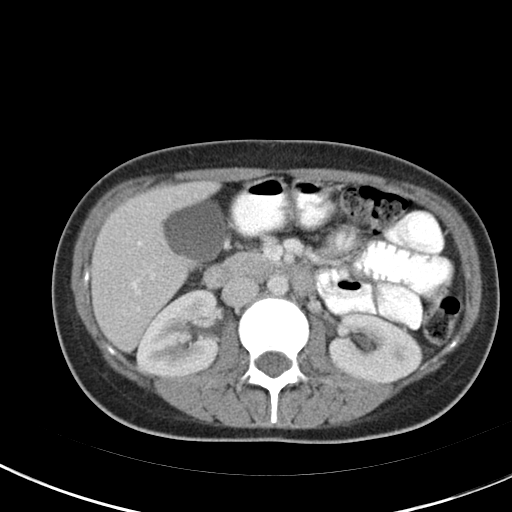
[im 65/81  soft-tissue]
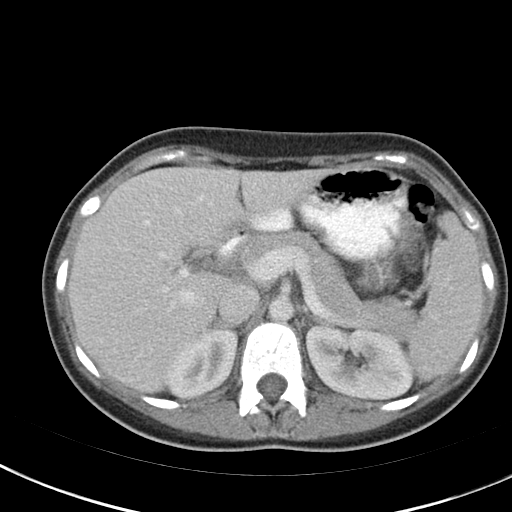
[im 70/81  soft-tissue]
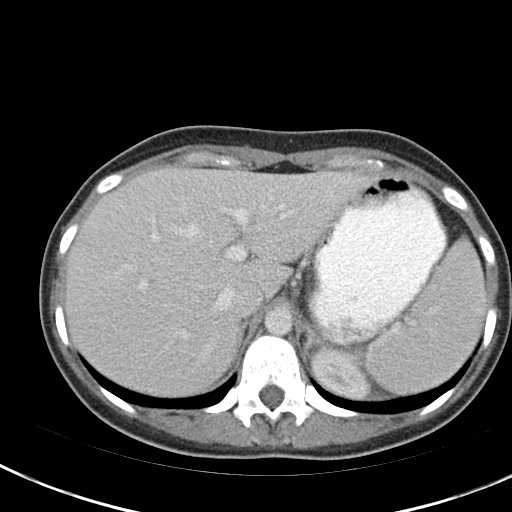
[im 75/81  soft-tissue]
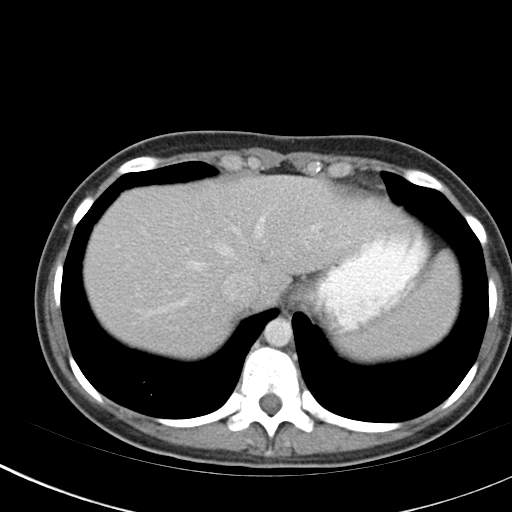

[Series 4: abd_pel_with 3.0 spo cor · coronal · 0.59mm/px · 3 of 59 slices shown]
[im 20/59  soft-tissue]
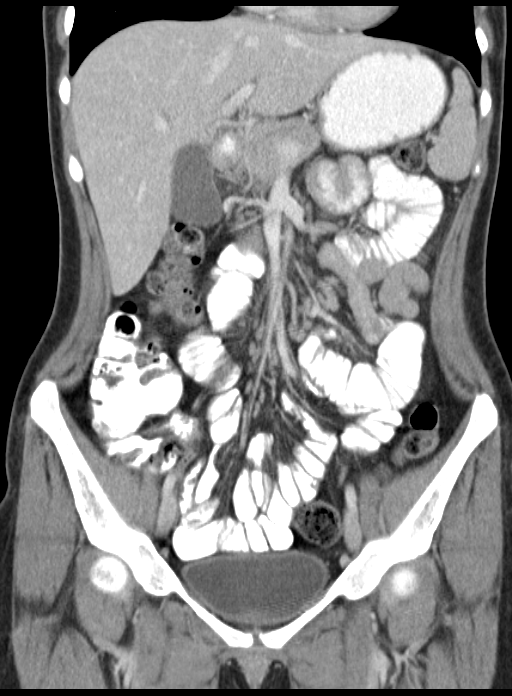
[im 26/59  soft-tissue]
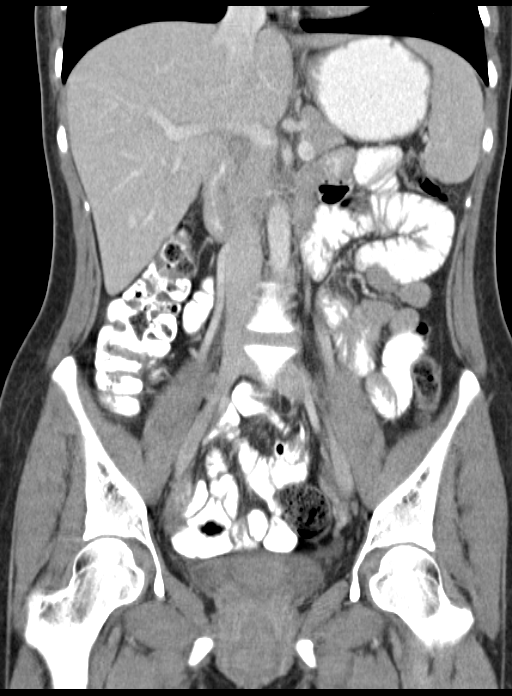
[im 33/59  soft-tissue]
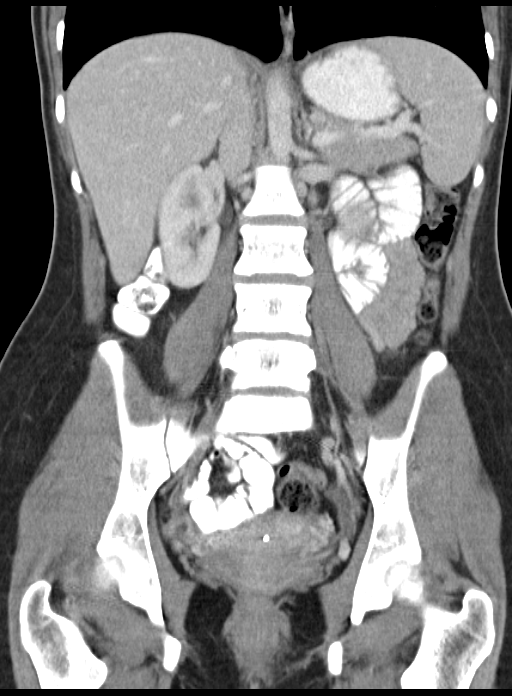

[16 of 46 positions shown; findings below may reference images not displayed]

FINDINGS: Lung bases are clear. No pleural or pericardial fluid. The liver has
a normal appearance. No calcified gallstones. The spleen is normal.
The pancreas is normal. The adrenal glands are normal. The kidneys
are normal. No cyst, mass, stone or hydronephrosis. The aorta and
IVC are normal. The uterus is retroverted. IUD is in place. No acute
finding. No adnexal lesion. No free fluid in the pelvis. The
appendix is well seen in a normal location within normal appearance.
No other bowel pathology.
IMPRESSION: No pathologic finding. No cause of right-sided pain identified.
Normal appearing appendix.

Incidental note of retroverted uterus with IUD.
